# Patient Record
Sex: Female | Born: 1975 | Race: White | Hispanic: No | Marital: Married | Smoking: Current every day smoker
Health system: Southern US, Community
[De-identification: ages and names within clinical notes are randomized; demographics above are authoritative.]

## PROBLEM LIST (undated history)

## (undated) DIAGNOSIS — K900 Celiac disease: Secondary | ICD-10-CM

## (undated) HISTORY — PX: HERNIA REPAIR: SHX51

## (undated) HISTORY — PX: ABDOMINAL HYSTERECTOMY: SHX81

## (undated) HISTORY — PX: OOPHORECTOMY: SHX86

---

## 2010-10-20 ENCOUNTER — Other Ambulatory Visit: Payer: Self-pay | Admitting: Family Medicine

## 2010-10-20 ENCOUNTER — Ambulatory Visit
Admission: RE | Admit: 2010-10-20 | Discharge: 2010-10-20 | Disposition: A | Discharge status: Home / Self Care | Payer: PRIVATE HEALTH INSURANCE | Source: Ambulatory Visit | Attending: Family Medicine | Admitting: Family Medicine

## 2010-10-20 ENCOUNTER — Encounter: Payer: Self-pay | Admitting: Family Medicine

## 2010-10-20 ENCOUNTER — Inpatient Hospital Stay (INDEPENDENT_AMBULATORY_CARE_PROVIDER_SITE_OTHER)
Admission: RE | Admit: 2010-10-20 | Discharge: 2010-10-20 | Disposition: A | Discharge status: Home / Self Care | Payer: PRIVATE HEALTH INSURANCE | Source: Ambulatory Visit | Attending: Family Medicine | Admitting: Family Medicine

## 2010-10-20 DIAGNOSIS — S8010XA Contusion of unspecified lower leg, initial encounter: Secondary | ICD-10-CM

## 2010-10-20 DIAGNOSIS — L03119 Cellulitis of unspecified part of limb: Secondary | ICD-10-CM

## 2010-10-20 DIAGNOSIS — J4489 Other specified chronic obstructive pulmonary disease: Secondary | ICD-10-CM | POA: Insufficient documentation

## 2010-10-20 DIAGNOSIS — L02419 Cutaneous abscess of limb, unspecified: Secondary | ICD-10-CM

## 2010-10-20 DIAGNOSIS — J449 Chronic obstructive pulmonary disease, unspecified: Secondary | ICD-10-CM | POA: Insufficient documentation

## 2010-10-23 ENCOUNTER — Telehealth (INDEPENDENT_AMBULATORY_CARE_PROVIDER_SITE_OTHER): Payer: Self-pay | Admitting: *Deleted

## 2011-04-09 NOTE — Progress Notes (Signed)
Summary: CELLULITIS (rm 4)   Vital Signs:  Patient Profile:   35 Years Old Female CC:      left shin pain and redness Height:     65.5 inches Weight:      165 pounds O2 Sat:      98 % O2 treatment:    Room Air Temp:     98.9 degrees F oral Pulse rate:   82 / minute Resp:     16 per minute BP sitting:   111 / 70  (left arm) Cuff size:   regular  Vitals Entered By: Lajean Saver RN (October 20, 2010 2:42 PM)                  Updated Prior Medication List: No Medications Current Allergies: ! PCNHistory of Present Illness Chief Complaint: left shin pain and redness History of Present Illness:  Subjective:  Patient complains of contusion to left pre-tibial area 3 weeks ago (riding lawn mower hit anterior leg).  She has had persistent pain/swelling, although improved.  No calf tenderness.  REVIEW OF SYSTEMS Constitutional Symptoms      Denies fever, chills, night sweats, weight loss, weight gain, and fatigue.  Eyes       Denies change in vision, eye pain, eye discharge, glasses, contact lenses, and eye surgery. Ear/Nose/Throat/Mouth       Denies hearing loss/aids, change in hearing, ear pain, ear discharge, dizziness, frequent runny nose, frequent nose bleeds, sinus problems, sore throat, hoarseness, and tooth pain or bleeding.  Respiratory       Denies dry cough, productive cough, wheezing, shortness of breath, asthma, bronchitis, and emphysema/COPD.  Cardiovascular       Denies murmurs, chest pain, and tires easily with exhertion.    Gastrointestinal       Denies stomach pain, nausea/vomiting, diarrhea, constipation, blood in bowel movements, and indigestion. Genitourniary       Denies painful urination, kidney stones, and loss of urinary control. Neurological       Denies paralysis, seizures, and fainting/blackouts. Musculoskeletal       Denies muscle pain, joint pain, joint stiffness, decreased range of motion, redness, swelling, muscle weakness, and gout.  Skin   Complains of bruising.      Denies unusual mles/lumps or sores and hair/skin or nail changes.      Comments: left shin Psych       Denies mood changes, temper/anger issues, anxiety/stress, speech problems, depression, and sleep problems. Other Comments: Patient's left shin was hit by a lwanmower piece about 3 weeks ago. Her shin is still red, inflamed, and warm to touch. She has constant pain. She has taken advil for pain relief   Past History:  Past Medical History: COPD  Past Surgical History: Hysterectomy right oophorectomy Inguinal herniorrhaphy right wrist tedon release  Family History: none  Social History: Alcohol use-no Drug use-no Quit smoking 9 months agoDrug Use:  no   Objective:  Appearance:  Patient appears healthy, stated age, and in no acute distress  Left lower leg pre-tibial area:  mild erythema, swelling, tenderness, localized over mid-tibial.  Area slightly warm and erythematous.  Tenderness extends to lateral compartments but not posterior calf.  Mild pain with resisted dorsiflexion of foot.  Distal neurovascular intact. X-ray left tibia/fibula:  Negative  Assessment New Problems: CELLULITIS, LEG, LEFT (ICD-682.6) CONTUSION, LOWER LEG, LEFT (ICD-924.10) COPD (ICD-496)   Plan New Medications/Changes: NAPROXEN 500 MG TABS (NAPROXEN) One by mouth two times a day pc  #20 x 1,  10/20/2010, Donna Christen MD CEPHALEXIN 500 MG TABS (CEPHALEXIN) One by mouth three times daily (every 8 hours)  #30 x 0, 10/20/2010, Donna Christen MD  New Orders: T-DG Tibia/Fibula*L* [86578] Ace Wraps 3-5 in/yard  [I6962] New Patient Level III [95284] Planning Comments:   Begin Keflex and Naproxen.  Applied ace wraps with graduated compression.  Advised to obtain light compression below knee medical support hose and wear daytime until healed.  Elevate leg.  Apply heating pad several times daily. Return for worsening symptoms.   The patient and/or caregiver has been counseled  thoroughly with regard to medications prescribed including dosage, schedule, interactions, rationale for use, and possible side effects and they verbalize understanding.  Diagnoses and expected course of recovery discussed and will return if not improved as expected or if the condition worsens. Patient and/or caregiver verbalized understanding.  Prescriptions: NAPROXEN 500 MG TABS (NAPROXEN) One by mouth two times a day pc  #20 x 1   Entered and Authorized by:   Donna Christen MD   Signed by:   Donna Christen MD on 10/20/2010   Method used:   Print then Give to Patient   RxID:   [id number] CEPHALEXIN 500 MG TABS (CEPHALEXIN) One by mouth three times daily (every 8 hours)  #30 x 0   Entered and Authorized by:   Donna Christen MD   Signed by:   Donna Christen MD on 10/20/2010   Method used:   Print then Give to Patient   RxID:   [id number]   Orders Added: 1)  T-DG Tibia/Fibula*L* [73590] 2)  Ace Wraps 3-5 in/yard  [E3329] 3)  New Patient Level III [51884]

## 2011-04-09 NOTE — Telephone Encounter (Signed)
  Phone Note Outgoing Call Call back at [facility] Phone [phone]   Call placed by: Lajean Saver RN,  October 23, 2010 5:08 PM Call placed to: Patient Action Taken: Phone Call Completed Summary of Call: Callback: Patient reports improvement in leg. Will call with questions

## 2011-07-01 ENCOUNTER — Emergency Department
Admission: EM | Admit: 2011-07-01 | Discharge: 2011-07-01 | Disposition: A | Discharge status: Home / Self Care | Payer: PRIVATE HEALTH INSURANCE | Source: Home / Self Care | Attending: Emergency Medicine | Admitting: Emergency Medicine

## 2011-07-01 DIAGNOSIS — J069 Acute upper respiratory infection, unspecified: Secondary | ICD-10-CM

## 2011-07-01 DIAGNOSIS — J329 Chronic sinusitis, unspecified: Secondary | ICD-10-CM

## 2011-07-01 HISTORY — DX: Celiac disease: K90.0

## 2011-07-01 MED ORDER — AZITHROMYCIN 250 MG PO TABS: ORAL_TABLET | ORAL | Status: AC

## 2011-07-01 NOTE — ED Provider Notes (Signed)
History     CSN: [id number]  Arrival date & time 07/01/11  1337   First MD Initiated Contact with Patient 07/01/11 1350      Chief Complaint  Patient presents with  . Cough  . Fever    (Consider location/radiation/quality/duration/timing/severity/associated sxs/prior treatment) HPI Alyssa Kane is a 36 y.o. female who complains of onset of cold symptoms for 2-3 days.  + sore throat + cough No pleuritic pain No wheezing +nasal congestion + post-nasal drainage + sinus pain/pressure No chest congestion No itchy/red eyes No earache No hemoptysis No SOB No chills/sweats No fever No nausea No vomiting No abdominal pain No diarrhea No skin rashes + fatigue No myalgias No headache    Past Medical History  Diagnosis Date  . Celiac disease     Past Surgical History  Procedure Date  . Abdominal hysterectomy   . Hernia repair   . Oophorectomy     right    Family History  Problem Relation Age of Onset  . Alzheimer's disease Father   . Depression Brother     History  Substance Use Topics  . Smoking status: Current Everyday Smoker  . Smokeless tobacco: Not on file  . Alcohol Use: No    OB History    Grav Para Term Preterm Abortions TAB SAB Ect Mult Living                  Review of Systems  All other systems reviewed and are negative.    Allergies  Penicillins  Home Medications   Current Outpatient Rx  Name Route Sig Dispense Refill  . AZITHROMYCIN 250 MG PO TABS  Use as directed 1 each 0    BP 104/72  Pulse 92  Temp(Src) 98.4 F (36.9 C) (Oral)  Resp 20  SpO2 99%  Physical Exam  Nursing note and vitals reviewed. Constitutional: She is oriented to person, place, and time. She appears well-developed and well-nourished.  HENT:  Head: Normocephalic and atraumatic.  Right Ear: Tympanic membrane, external ear and ear canal normal.  Left Ear: Tympanic membrane, external ear and ear canal normal.  Nose: Mucosal edema and rhinorrhea present.   Mouth/Throat: Posterior oropharyngeal erythema present. No oropharyngeal exudate or posterior oropharyngeal edema.  Eyes: No scleral icterus.  Neck: Neck supple.  Cardiovascular: Regular rhythm and normal heart sounds.   Pulmonary/Chest: Effort normal and breath sounds normal. No respiratory distress.  Neurological: She is alert and oriented to person, place, and time.  Skin: Skin is warm and dry.  Psychiatric: She has a normal mood and affect. Her speech is normal.    ED Course  Procedures (including critical care time)  Labs Reviewed - No data to display No results found.   1. Acute upper respiratory infections of unspecified site   2. Sinusitis       MDM  1)  Take the prescribed antibiotic as instructed.  Differential diagnosis includes flu, virus, sinusitis. 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 2)  Use nasal saline solution (over the counter) at least 3 times a day. 3)  Use over the counter decongestants like Zyrtec-D every 12 hours as needed to help with congestion.  If you have hypertension, do not take medicines with sudafed.  4)  Can take tylenol every 6 hours or motrin every 8 hours for pain or fever. 5)  Follow up with your  primary doctor if no improvement in 5-7 days, sooner if increasing pain, fever, or new symptoms.     Lily Kocher, MD 07/01/11 [phone]

## 2011-07-01 NOTE — ED Notes (Signed)
States upper respiratory symptoms started Friday and this morning abdominal pain and diarrhea.

## 2012-01-27 IMAGING — CR DG TIBIA/FIBULA 2V*L*
2 series · 2 of 2 positions shown · non-contrast
Comparison: None.

CLINICAL DATA: Lawnmower fell on patient, pain in the left lower
leg

LEFT TIBIA AND FIBULA - 2 VIEW

[view not recorded (1 of 2)]
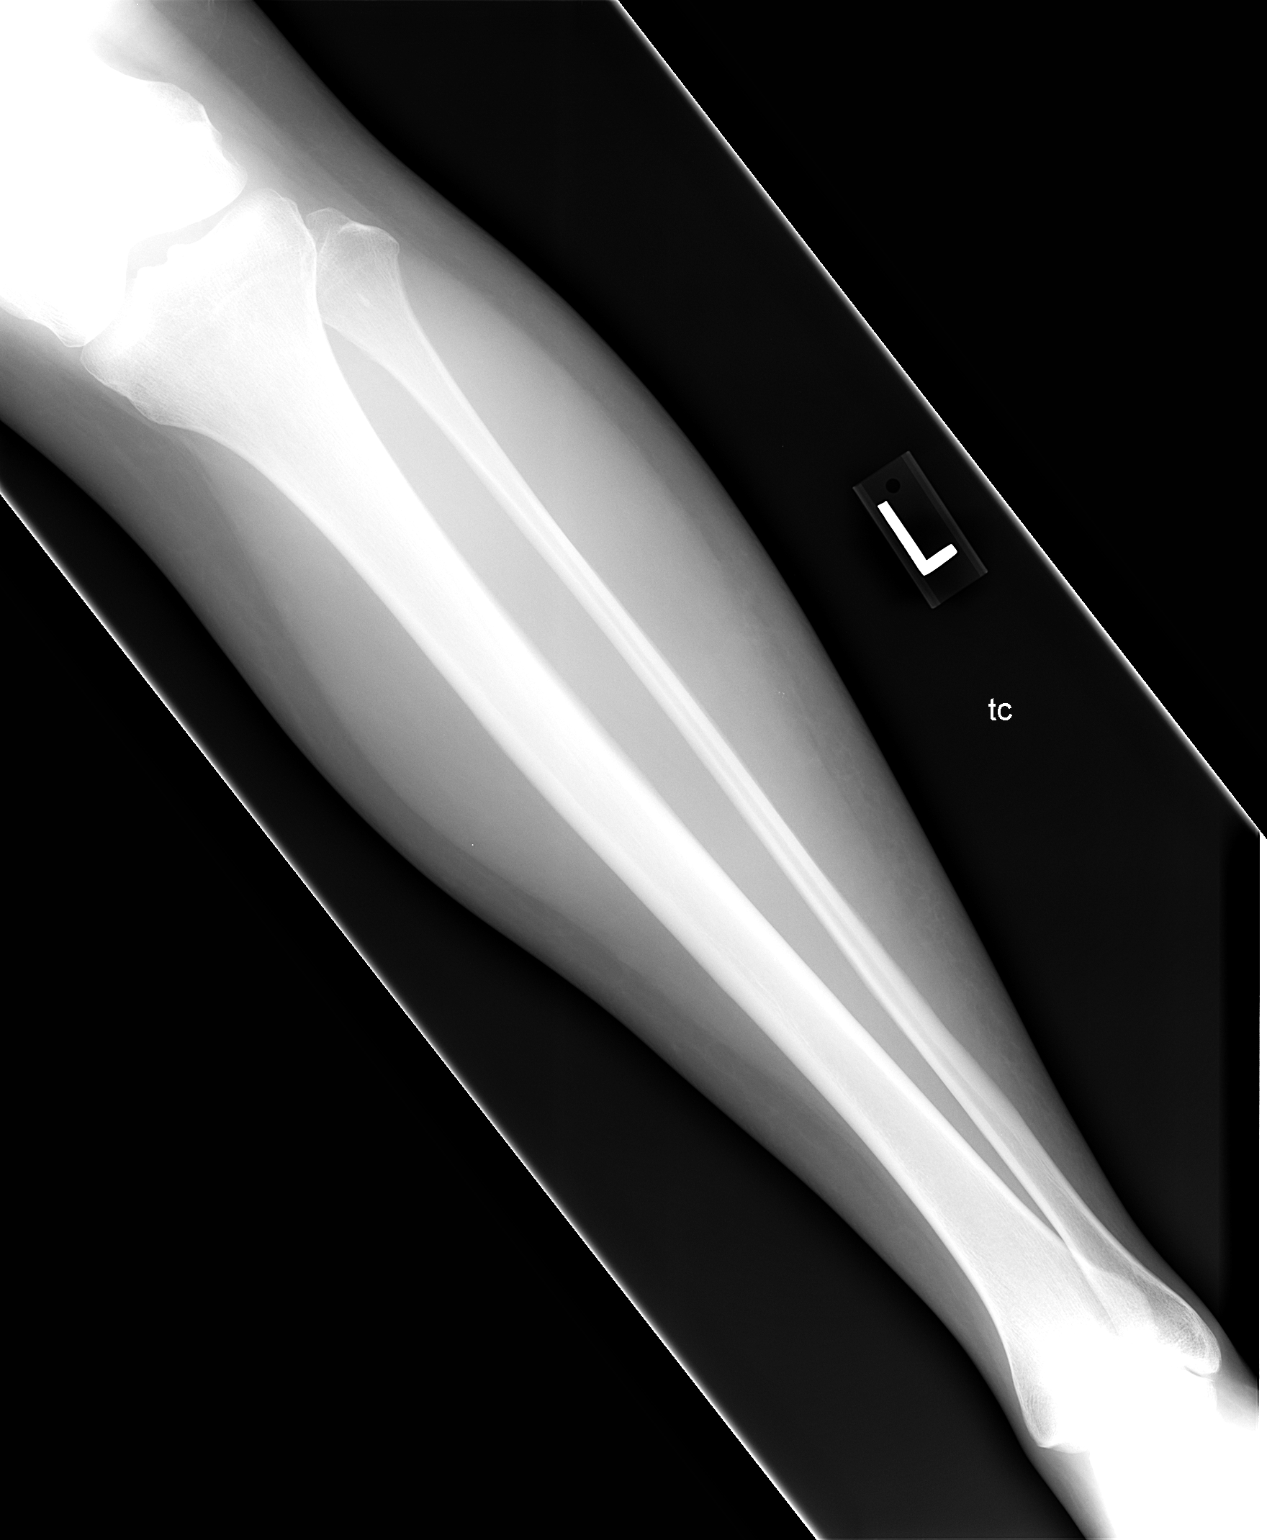

[view not recorded (2 of 2)]
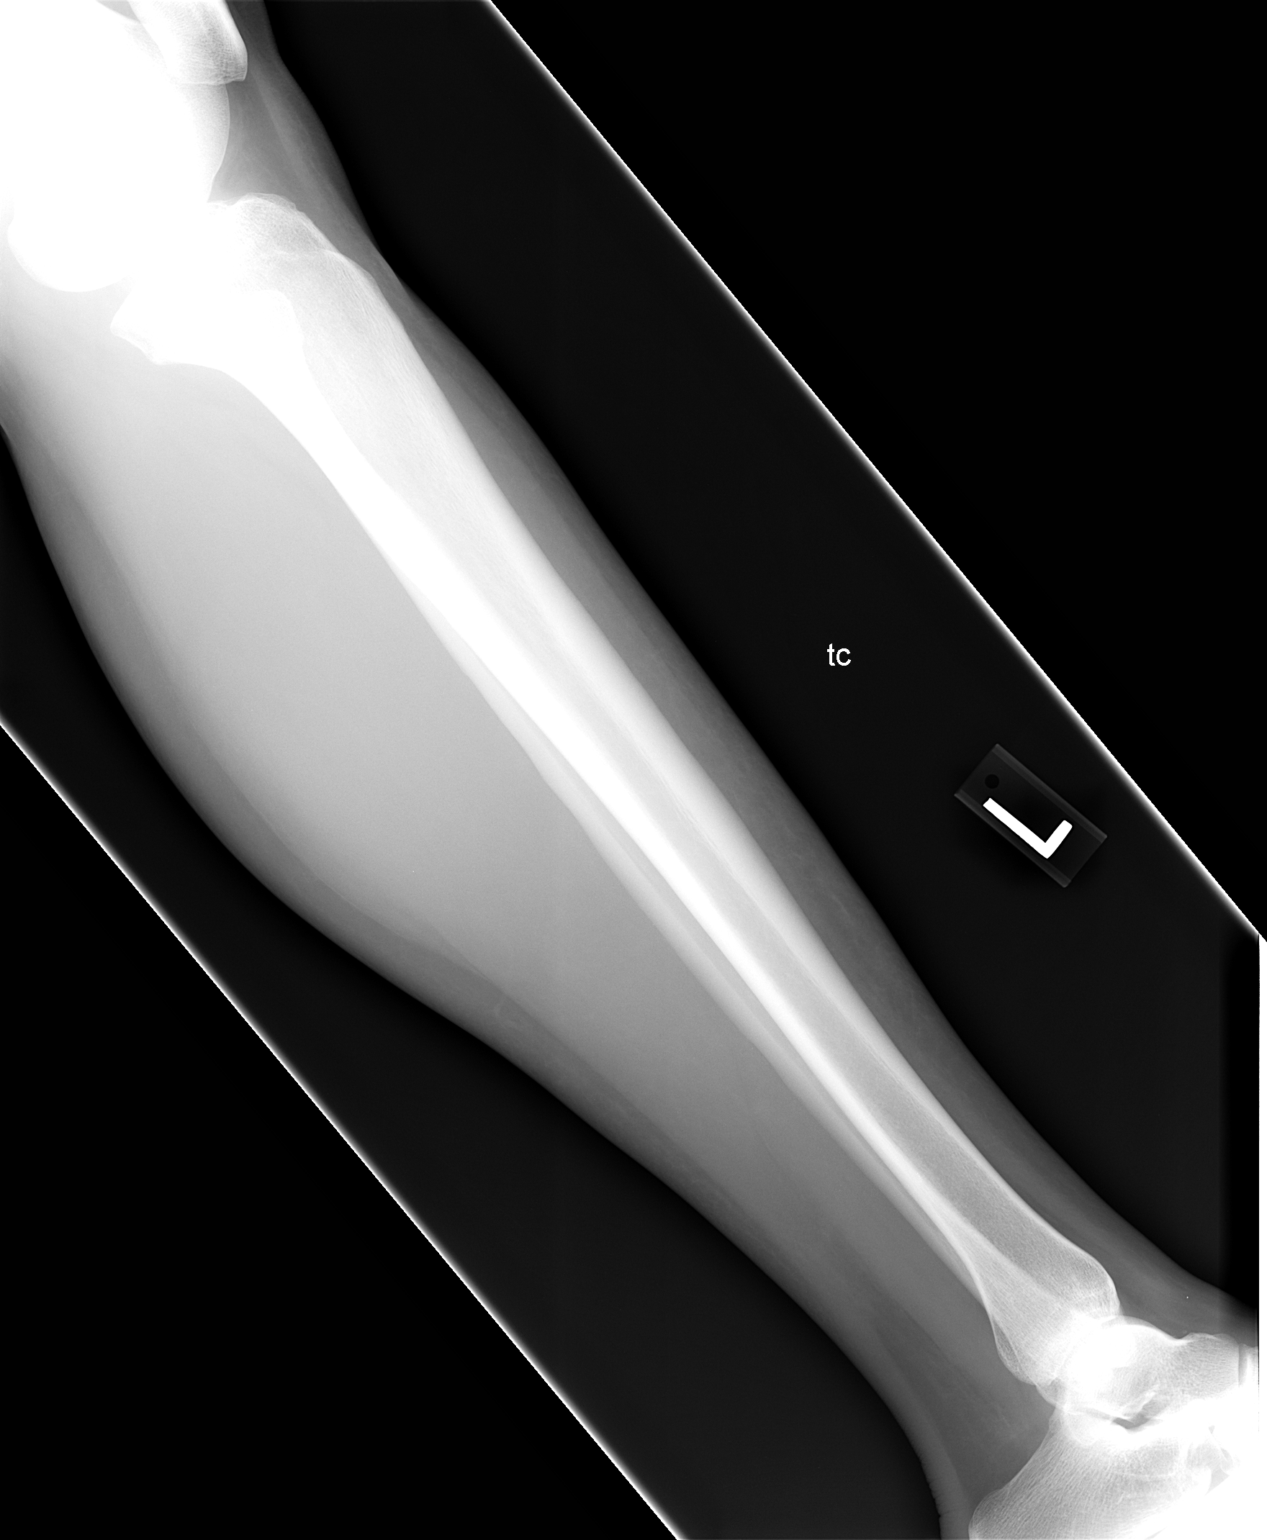

[2 of 2 positions shown; findings below may reference images not displayed]

FINDINGS: No acute fracture is seen.  The left tibia and fibula
appear intact and normally aligned.
IMPRESSION: Negative left tibia and fibula.

## 2016-12-28 ENCOUNTER — Encounter: Payer: Self-pay | Admitting: Emergency Medicine

## 2016-12-28 ENCOUNTER — Emergency Department
Admission: EM | Admit: 2016-12-28 | Discharge: 2016-12-28 | Disposition: A | Discharge status: Home / Self Care | Payer: 59 | Source: Home / Self Care | Attending: Family Medicine | Admitting: Family Medicine

## 2016-12-28 DIAGNOSIS — L089 Local infection of the skin and subcutaneous tissue, unspecified: Secondary | ICD-10-CM | POA: Diagnosis not present

## 2016-12-28 DIAGNOSIS — L723 Sebaceous cyst: Secondary | ICD-10-CM

## 2016-12-28 MED ORDER — DOXYCYCLINE HYCLATE 100 MG PO CAPS: 100.0000 mg | ORAL_CAPSULE | Freq: Two times a day (BID) | ORAL | 0 refills | Status: AC

## 2016-12-28 NOTE — Discharge Instructions (Signed)
Leave bandage in place until follow-up visit tomorrow.   Keep bandage clean and dry.  May take Ibuprofen 200mg, 4 tabs every 8 hours with food.  ° °

## 2016-12-28 NOTE — ED Provider Notes (Signed)
[facility]    CSN: [id number] Arrival date & time: 12/28/16  2820     History   Chief Complaint Chief Complaint  Patient presents with  . Abscess    HPI Alyssa Kane is a 41 y.o. female.   Patient complains of one year history of "ingrown hair" on her right inguinal region.  Yesterday she noticed increased pain, redness, and swelling.  No drainage from the lesion.   The history is provided by the patient.  Abscess  Abscess location: right inguinal area. Size:  1.5cm by 2cm Abscess quality: fluctuance, painful and redness   Abscess quality: not draining, no warmth and not weeping   Red streaking: no   Duration:  1 day Progression:  Worsening Pain details:    Quality:  Aching   Severity:  Moderate   Duration:  1 day   Timing:  Constant   Progression:  Worsening Chronicity:  Chronic Context: not skin injury   Relieved by:  None tried Worsened by:  Draining/squeezing Ineffective treatments:  None tried Associated symptoms: no fever     Past Medical History:  Diagnosis Date  . Celiac disease     Patient Active Problem List   Diagnosis Date Noted  . COPD 10/20/2010    Past Surgical History:  Procedure Laterality Date  . ABDOMINAL HYSTERECTOMY    . HERNIA REPAIR    . OOPHORECTOMY     right    OB History    No data available       Home Medications    Prior to Admission medications   Medication Sig Start Date End Date Taking? Authorizing Provider  doxycycline (VIBRAMYCIN) 100 MG capsule Take 1 capsule (100 mg total) by mouth 2 (two) times daily. Take with food. 12/28/16   Lattie Haw, MD    Family History Family History  Problem Relation Age of Onset  . Alzheimer's disease Father   . Depression Brother     Social History Social History  Substance Use Topics  . Smoking status: Current Every Day Smoker    Types: Cigarettes  . Smokeless tobacco: Never Used  . Alcohol use No     Allergies   Penicillins   Review  of Systems Review of Systems  Constitutional: Negative for fever.  All other systems reviewed and are negative.    Physical Exam Triage Vital Signs ED Triage Vitals [12/28/16 1038]  Enc Vitals Group     BP 108/67     Pulse Rate 72     Resp      Temp 98.4 F (36.9 C)     Temp Source Oral     SpO2 99 %     Weight 163 lb (73.9 kg)     Height      Head Circumference      Peak Flow      Pain Score 7     Pain Loc      Pain Edu?      Excl. in GC?    No data found.   Updated Vital Signs BP 108/67 (BP Location: Right Arm)   Pulse 72   Temp 98.4 F (36.9 C) (Oral)   Wt 163 lb (73.9 kg)   SpO2 99%   BMI 26.71 kg/m   Visual Acuity Right Eye Distance:   Left Eye Distance:   Bilateral Distance:    Right Eye Near:   Left Eye Near:    Bilateral Near:     Physical Exam  Constitutional:  She appears well-developed and well-nourished. No distress.  HENT:  Head: Normocephalic.  Eyes: Pupils are equal, round, and reactive to light.  Cardiovascular: Normal rate.   Pulmonary/Chest: Effort normal.  Abdominal: There is no tenderness.  Neurological: She is alert.  Skin: Skin is warm and dry.     In the right inguinal region is a 1.5cm by 2cm fluctuant cyst, erythematous and tender to palpation.  Nursing note and vitals reviewed.    UC Treatments / Results  Labs (all labs ordered are listed, but only abnormal results are displayed) Labs Reviewed - No data to display  EKG  EKG Interpretation None       Radiology No results found.  Procedures Procedures Incise and drain cyst/abscess Risks and benefits of procedure explained to patient and verbal consent obtained.  Using sterile technique, applied topical refrigerant spray followed by local anesthesia with 1% lidocaine with epinephrine, and cleansed affected area with Betadine and saline. Identified the most fluctuant area of lesion and incised with #11 blade.  Expressed blood and purulent material.  Inserted  Iodoform gauze packing.  Bandage applied.  Patient tolerated well   Medications Ordered in UC Medications - No data to display   Initial Impression / Assessment and Plan / UC Course  I have reviewed the triage vital signs and the nursing notes.  Pertinent labs & imaging results that were available during my care of the patient were reviewed by me and considered in my medical decision making (see chart for details).    Wound culture pending.  Begin doxycycline 100mg  BID. Leave bandage in place until follow-up visit tomorrow.  Keep bandage clean and dry. May take Ibuprofen 200mg , 4 tabs every 8 hours with food.     Final Clinical Impressions(s) / UC Diagnoses   Final diagnoses:  Infected sebaceous cyst    New Prescriptions New Prescriptions   DOXYCYCLINE (VIBRAMYCIN) 100 MG CAPSULE    Take 1 capsule (100 mg total) by mouth 2 (two) times daily. Take with food.         Lattie Haw, MD 12/28/16 [phone]

## 2016-12-28 NOTE — ED Triage Notes (Signed)
Pt c/o boil on the right side of her pubic area. She thinks it was an ingrown hair that got infected. Yesterday noticed redness and pain.

## 2016-12-29 ENCOUNTER — Encounter: Payer: Self-pay | Admitting: Emergency Medicine

## 2016-12-29 ENCOUNTER — Emergency Department (INDEPENDENT_AMBULATORY_CARE_PROVIDER_SITE_OTHER)
Admission: EM | Admit: 2016-12-29 | Discharge: 2016-12-29 | Disposition: A | Discharge status: Home / Self Care | Payer: 59 | Source: Home / Self Care | Attending: Family Medicine | Admitting: Family Medicine

## 2016-12-29 DIAGNOSIS — Z5189 Encounter for other specified aftercare: Secondary | ICD-10-CM

## 2016-12-29 NOTE — Discharge Instructions (Signed)
Keep wound bandaged, and change daily, until healed.

## 2016-12-29 NOTE — ED Notes (Signed)
Patient placed in a gown.

## 2016-12-29 NOTE — ED Triage Notes (Signed)
Patient states that she is here for a wound recheck from here visit yesterday. Cyst in the right groin area per pts. advise

## 2016-12-29 NOTE — ED Provider Notes (Signed)
[facility]    CSN: [id number] Arrival date & time: 12/29/16  1108     History   Chief Complaint Chief Complaint  Patient presents with  . Wound Check    HPI Charlann Wayne is a 41 y.o. female.   Patient returns for re-check I and D site right inguinal region.  She reports decreased pain.     The history is provided by the patient.    Past Medical History:  Diagnosis Date  . Celiac disease     Patient Active Problem List   Diagnosis Date Noted  . COPD 10/20/2010    Past Surgical History:  Procedure Laterality Date  . ABDOMINAL HYSTERECTOMY    . HERNIA REPAIR    . OOPHORECTOMY     right    OB History    No data available       Home Medications    Prior to Admission medications   Medication Sig Start Date End Date Taking? Authorizing Provider  doxycycline (VIBRAMYCIN) 100 MG capsule Take 1 capsule (100 mg total) by mouth 2 (two) times daily. Take with food. 12/28/16   Lattie Haw, MD    Family History Family History  Problem Relation Age of Onset  . Alzheimer's disease Father   . Depression Brother     Social History Social History  Substance Use Topics  . Smoking status: Current Every Day Smoker    Packs/day: 1.00    Types: Cigarettes  . Smokeless tobacco: Never Used  . Alcohol use No     Allergies   Penicillins   Review of Systems Review of Systems  Constitutional: Negative for activity change, appetite change, chills, diaphoresis, fatigue and fever.  All other systems reviewed and are negative.    Physical Exam Triage Vital Signs ED Triage Vitals  Enc Vitals Group     BP 12/29/16 1120 105/69     Pulse Rate 12/29/16 1120 79     Resp 12/29/16 1120 16     Temp 12/29/16 1120 98.2 F (36.8 C)     Temp Source 12/29/16 1120 Oral     SpO2 12/29/16 1120 100 %     Weight 12/29/16 1121 163 lb (73.9 kg)     Height 12/29/16 1121 5\' 5"  (1.651 m)     Head Circumference --      Peak Flow --      Pain Score  12/29/16 1121 4     Pain Loc --      Pain Edu? --      Excl. in GC? --    No data found.   Updated Vital Signs BP 105/69 (BP Location: Left Arm)   Pulse 79   Temp 98.2 F (36.8 C) (Oral)   Resp 16   Ht 5\' 5"  (1.651 m)   Wt 163 lb (73.9 kg)   SpO2 100%   BMI 27.12 kg/m   Visual Acuity Right Eye Distance:   Left Eye Distance:   Bilateral Distance:    Right Eye Near:   Left Eye Near:    Bilateral Near:     Physical Exam  Constitutional: She appears well-developed and well-nourished. No distress.  Eyes: Pupils are equal, round, and reactive to light.  Cardiovascular: Normal rate.   Pulmonary/Chest: Effort normal.  Neurological: She is alert.  Skin: Skin is warm and dry.  I and D site right inguinal area has decreased erythema and minimal tenderness to palpation.  Packing removed:  Wound shallow without purulent drainage.  Nursing note and vitals reviewed.    UC Treatments / Results  Labs (all labs ordered are listed, but only abnormal results are displayed) Labs Reviewed - No data to display  EKG  EKG Interpretation None       Radiology No results found.  Procedures Procedures (including critical care time)  Medications Ordered in UC Medications - No data to display   Initial Impression / Assessment and Plan / UC Course  I have reviewed the triage vital signs and the nursing notes.  Pertinent labs & imaging results that were available during my care of the patient were reviewed by me and considered in my medical decision making (see chart for details).    Wound healing well; no indication for additional packing. Keep wound bandaged, and change daily, until healed. Finish antibiotic (wound culture pending). Return for worsening symptoms.    Final Clinical Impressions(s) / UC Diagnoses   Final diagnoses:  Visit for wound check    New Prescriptions New Prescriptions   No medications on file         Lattie Haw, MD 12/29/16  1141

## 2016-12-29 NOTE — ED Notes (Signed)
Patient states "It is feeling better than it did yesterday."

## 2016-12-30 LAB — WOUND CULTURE: Organism ID, Bacteria: NORMAL

## 2016-12-31 ENCOUNTER — Telehealth: Payer: Self-pay | Admitting: *Deleted

## 2016-12-31 NOTE — Telephone Encounter (Signed)
Callback: No answer, Left message on mobile VM wcx is okay. Call back as needed.
